# Patient Record
Sex: Female | Born: 1935 | Race: White | Hispanic: No
Health system: Southern US, Community
[De-identification: ages and names within clinical notes are randomized; demographics above are authoritative.]

---

## 2004-09-13 ENCOUNTER — Ambulatory Visit: Payer: Self-pay | Admitting: Family Medicine

## 2004-10-25 ENCOUNTER — Ambulatory Visit: Payer: Self-pay | Admitting: Family Medicine

## 2006-07-09 IMAGING — US US THYROID
1 series · 17 of 25 positions shown · non-contrast
Comparison: none

REASON FOR EXAM: elevated TSH
COMMENTS:

[Series 1: us thyroid · 17 of 33 slices shown]
[im 1/33]
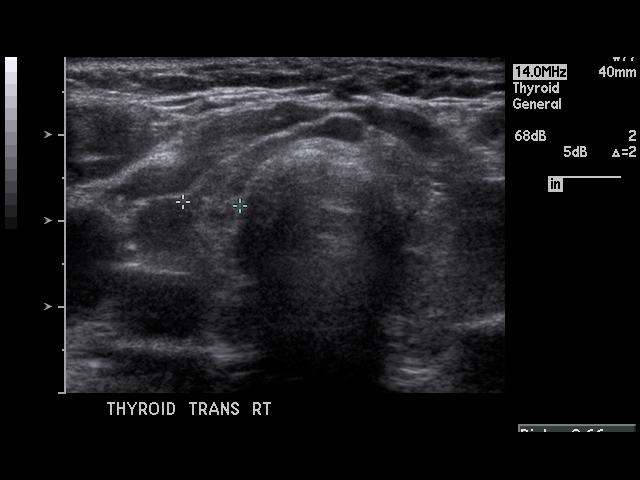
[im 3/33]
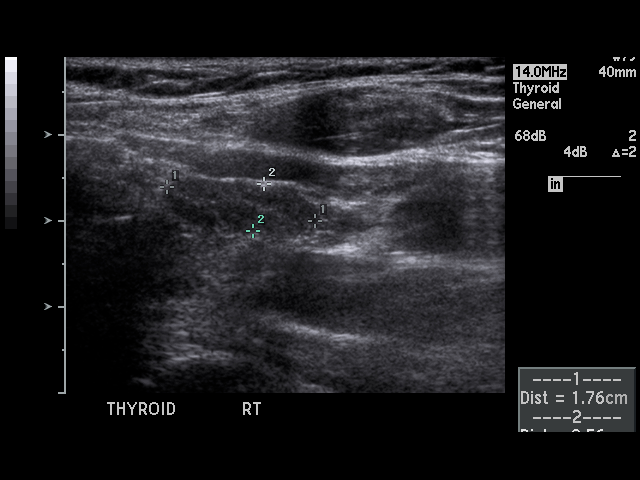
[im 5/33]
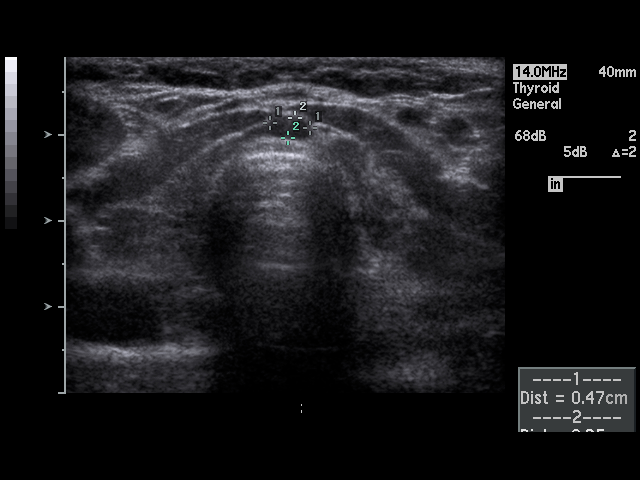
[im 7/33]
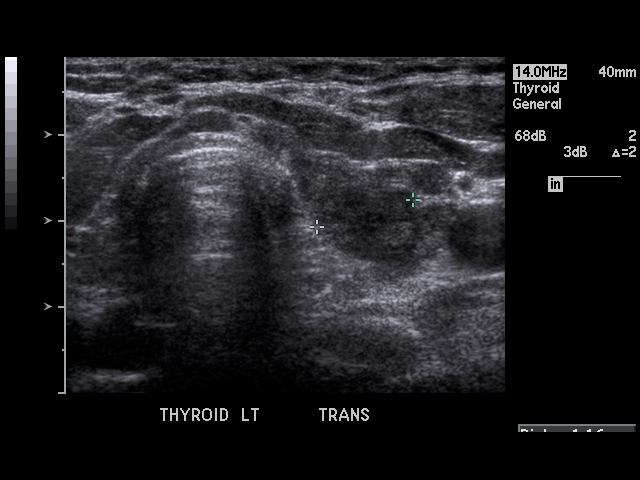
[im 9/33]
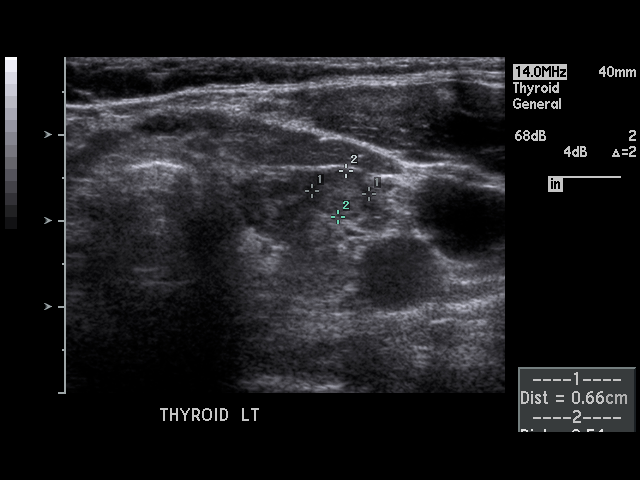
[im 11/33]
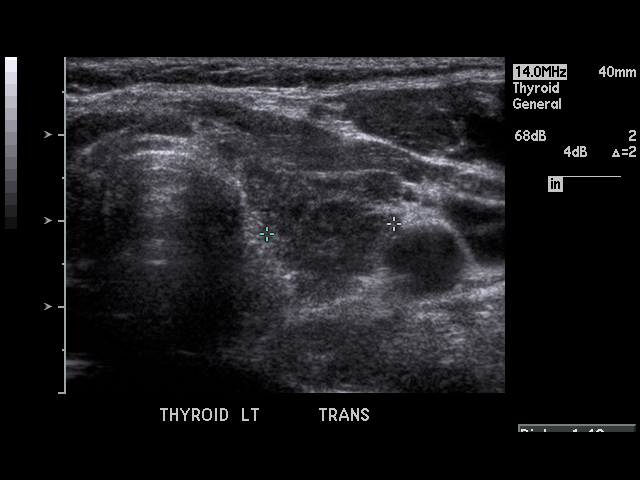
[im 13/33]
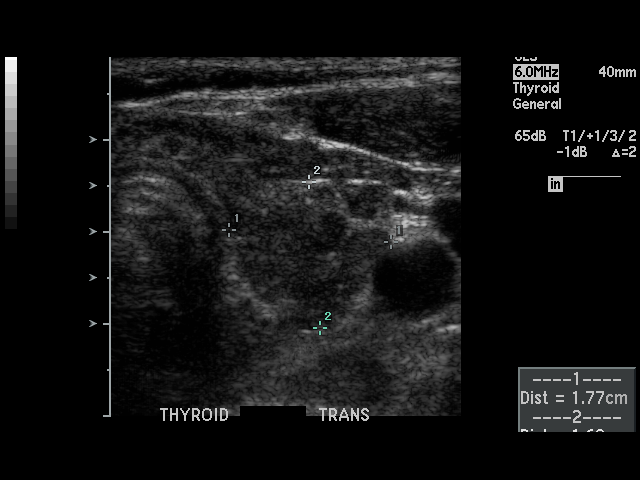
[im 15/33]
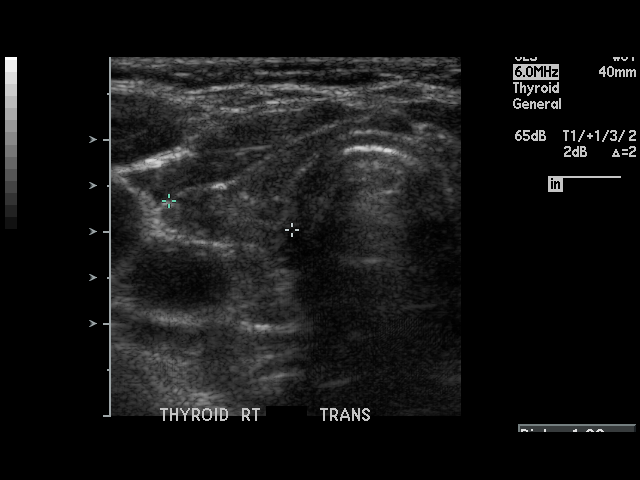
[im 17/33]
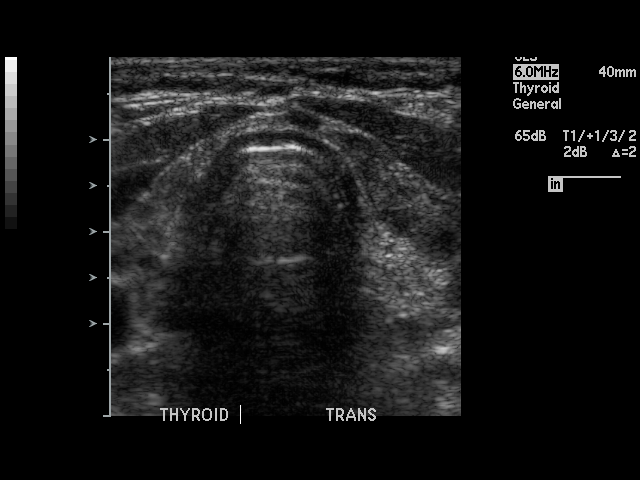
[im 18/33]
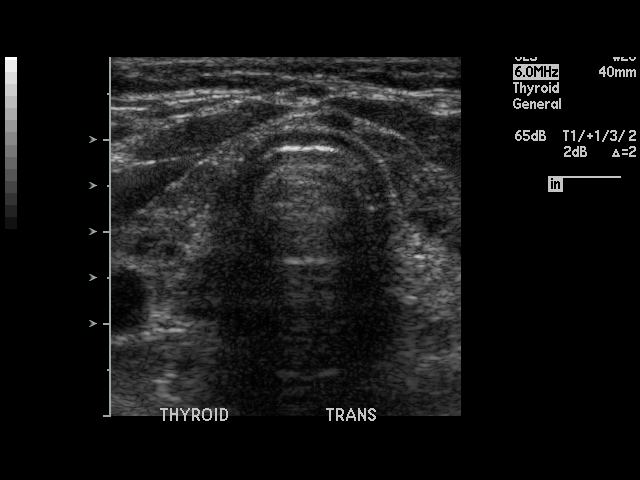
[im 21/33]
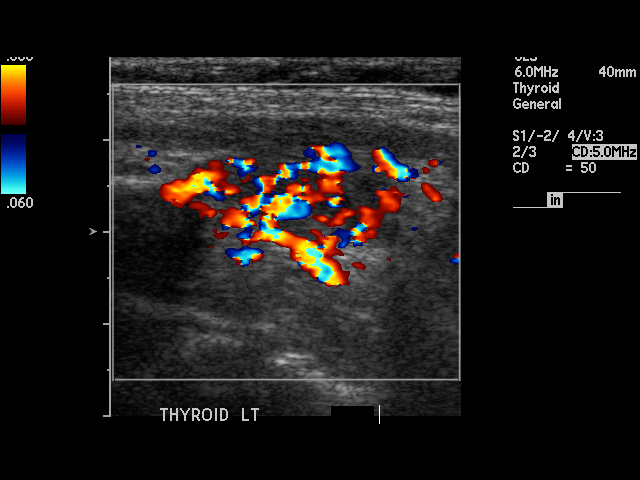
[im 22/33]
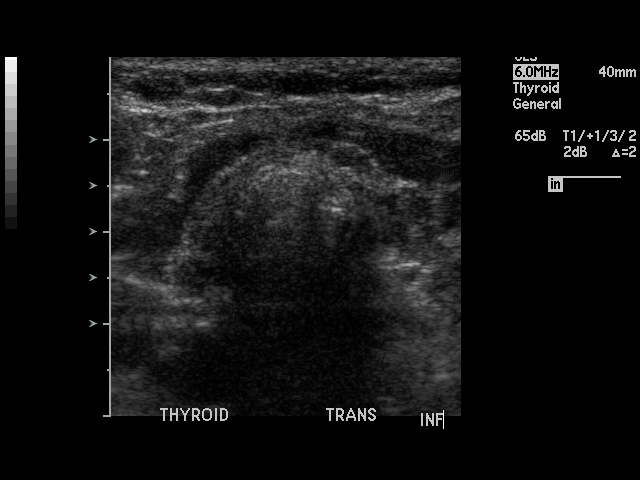
[im 25/33]
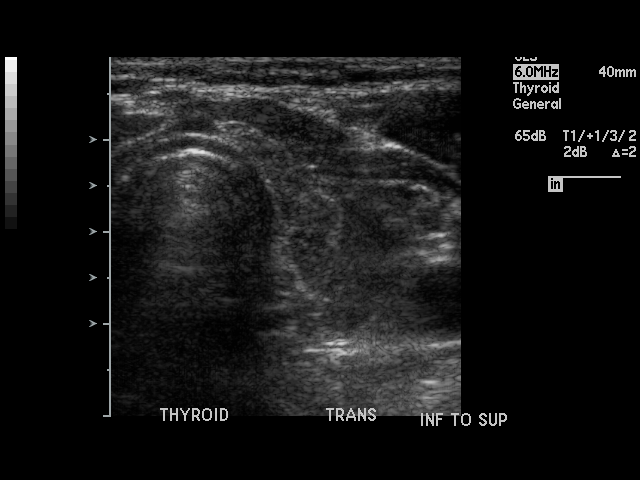
[im 26/33]
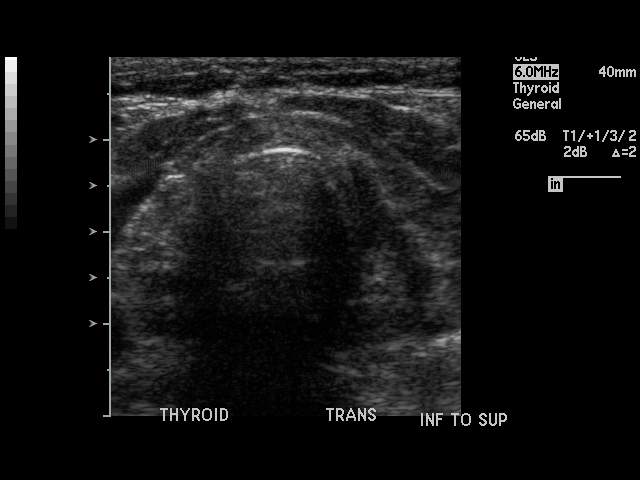
[im 29/33]
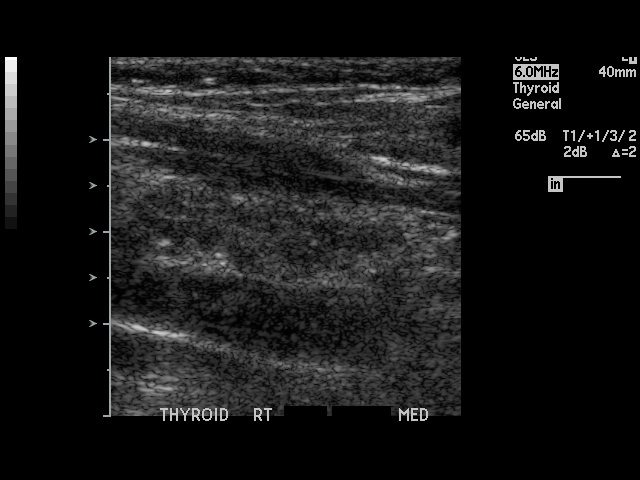
[im 30/33]
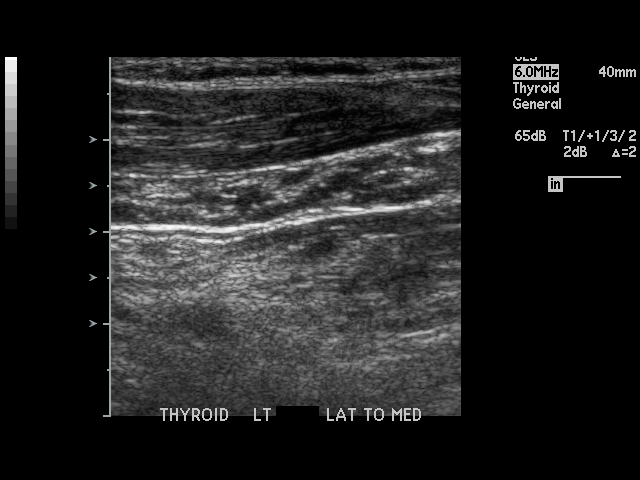
[im 33/33]
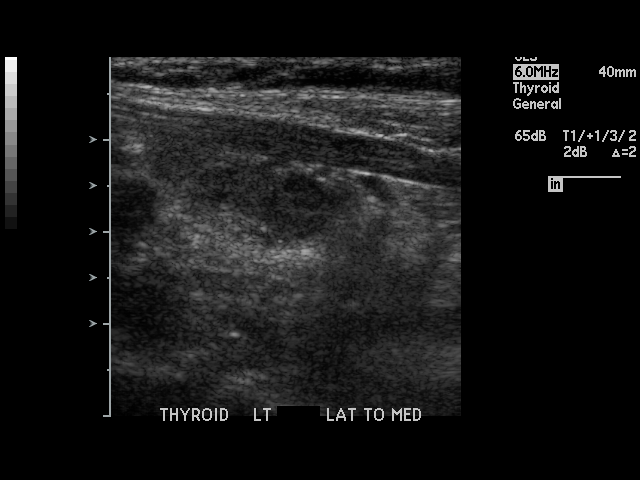

[17 of 25 positions shown; findings below may reference images not displayed]

PROCEDURE:     US  - US THYROID  - September 13, 2004  [DATE]

RESULT:        The RIGHT lobe measures 1.76 cm x .56 cm x .66 cm and the
LEFT lobe measures 2.92 cm x 1.6 cm x 1.77 cm.  In the LEFT lobe there are
two hypoechoic nodules with the larger measuring .75 cm in diameter and the
smaller measuring .52 cm in diameter.  There is also noted a .47 cm
hypoechoic nodule in the isthmus. No definite nodules are seen on the RIGHT.
IMPRESSION: 1.     The thyroid lobes are within the limits of normal for size.
2.     There are noted three small thyroid nodules in the LEFT lobe and
isthmus as noted above.

## 2009-12-07 ENCOUNTER — Ambulatory Visit: Payer: Self-pay | Admitting: Gastroenterology

## 2009-12-08 LAB — PATHOLOGY REPORT

## 2009-12-11 ENCOUNTER — Ambulatory Visit: Payer: Self-pay | Admitting: Gastroenterology

## 2009-12-21 ENCOUNTER — Ambulatory Visit: Payer: Self-pay | Admitting: Surgery

## 2009-12-22 LAB — CEA: CEA: 2.6 ng/mL (ref 0.0–4.7)

## 2009-12-29 ENCOUNTER — Inpatient Hospital Stay: Payer: Self-pay | Admitting: Surgery

## 2010-06-26 ENCOUNTER — Ambulatory Visit: Payer: Self-pay | Admitting: Family Medicine

## 2011-02-21 DEATH — deceased

## 2011-03-25 ENCOUNTER — Ambulatory Visit: Payer: Self-pay | Admitting: Gastroenterology

## 2011-03-26 LAB — PATHOLOGY REPORT

## 2011-10-06 IMAGING — CT CT ABD-PELV W/ CM
1 of 2 series · 14 of 32 positions shown, 18 images · non-contrast
Comparison: none

REASON FOR EXAM: SIGMOID COLON MASS
COMMENTS:

[Series 2: soft tissue · axial · 0.67mm/px · z∈[-900,-510]mm · 14 of 89 slices shown, 18 images]
[im 7/89  soft-tissue]
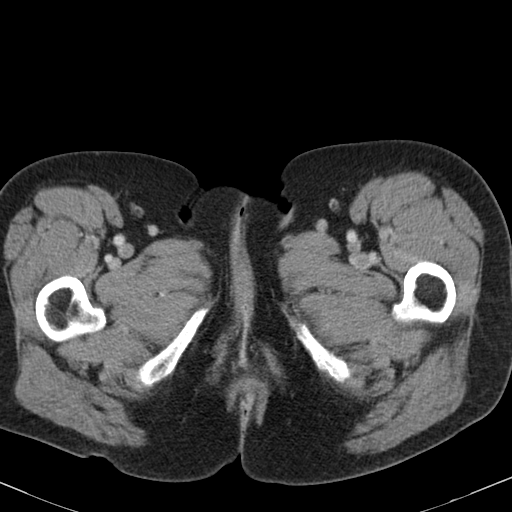
[im 7/89  bone]
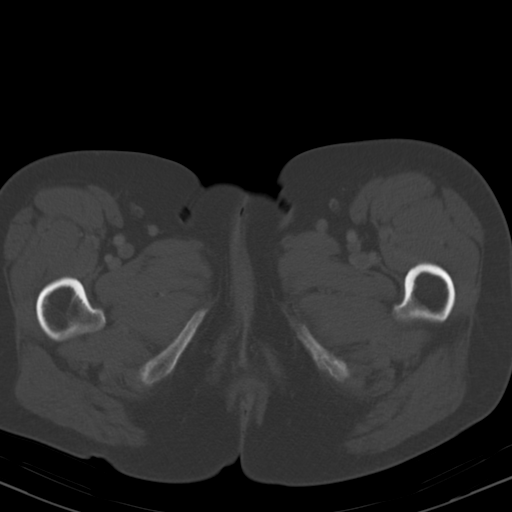
[im 14/89  soft-tissue]
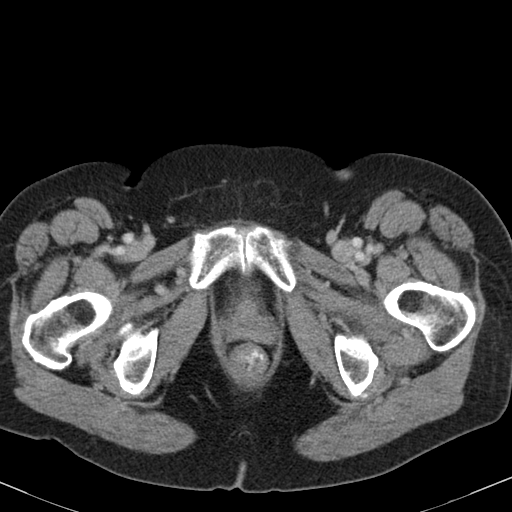
[im 21/89  soft-tissue]
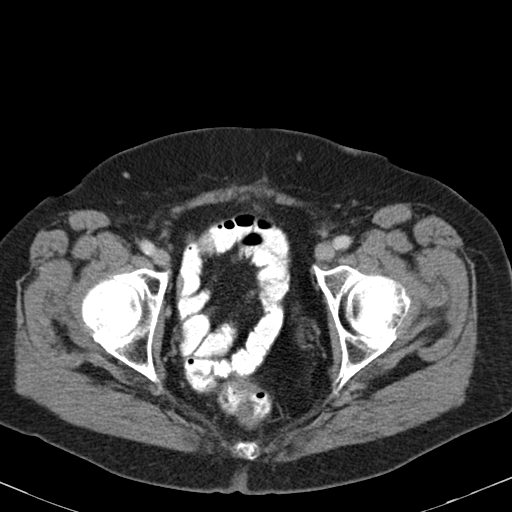
[im 28/89  soft-tissue]
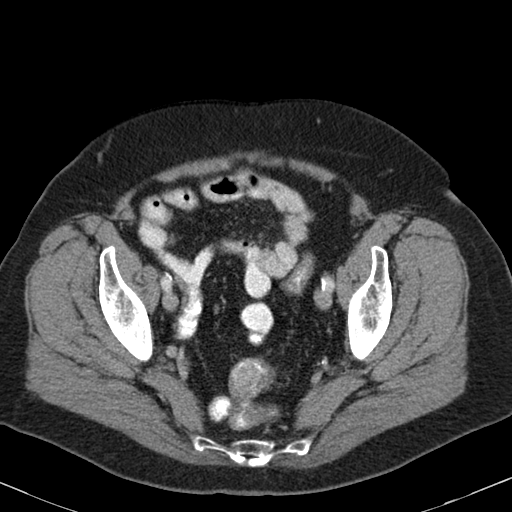
[im 34/89  soft-tissue]
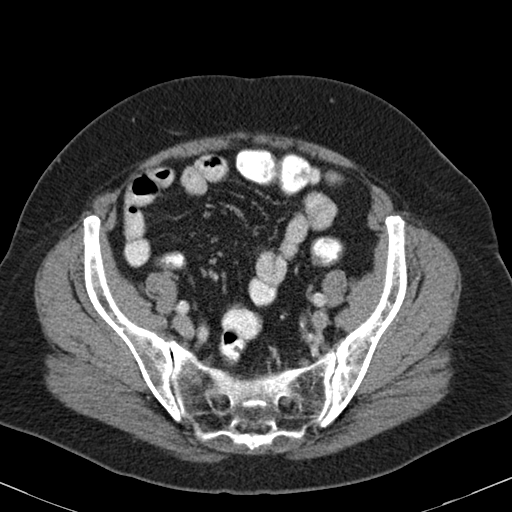
[im 41/89  soft-tissue]
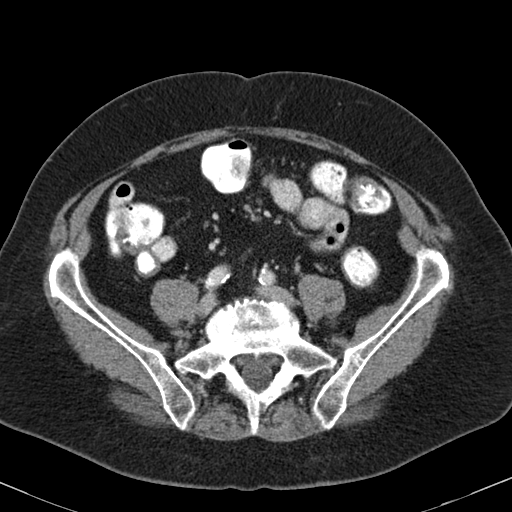
[im 48/89  soft-tissue]
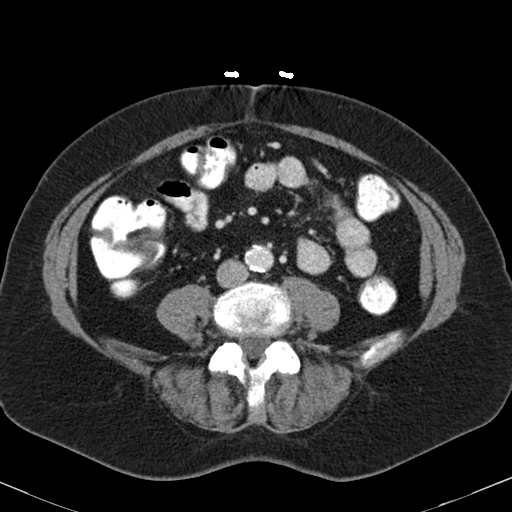
[im 55/89  soft-tissue]
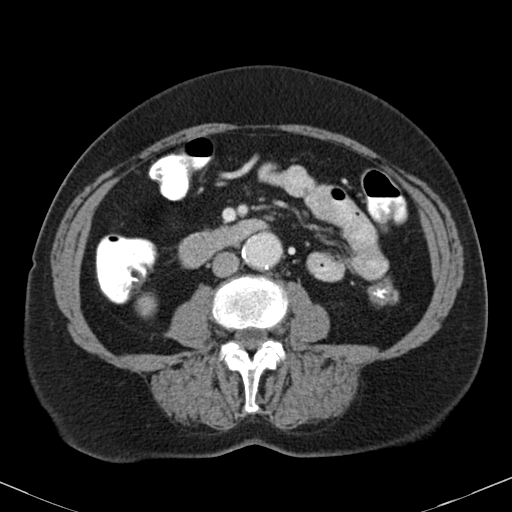
[im 61/89  soft-tissue]
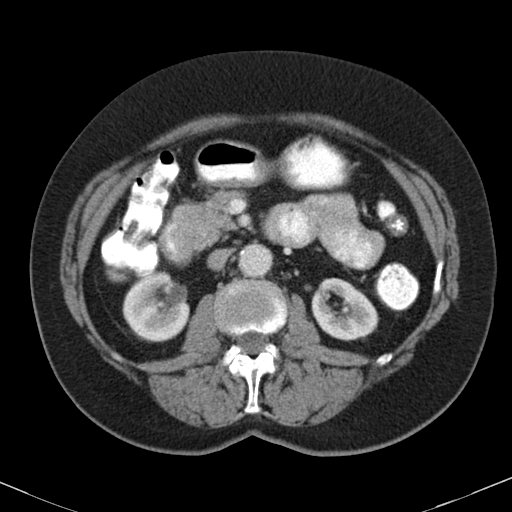
[im 61/89  bone]
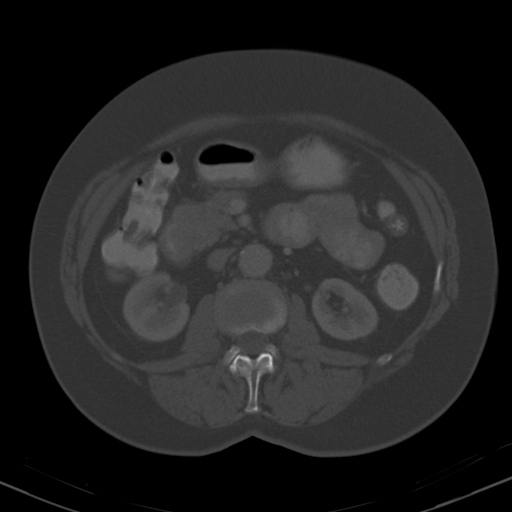
[im 68/89  soft-tissue]
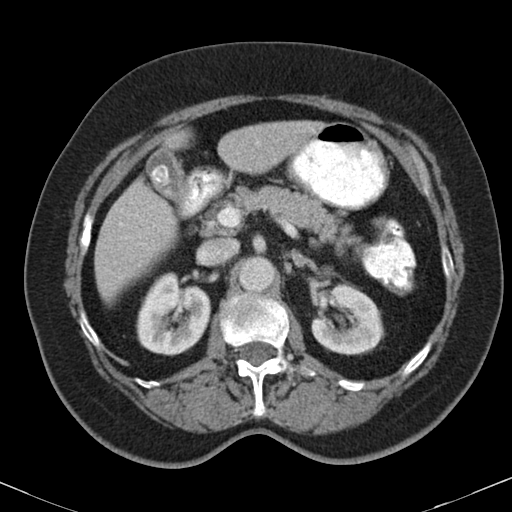
[im 75/89  soft-tissue]
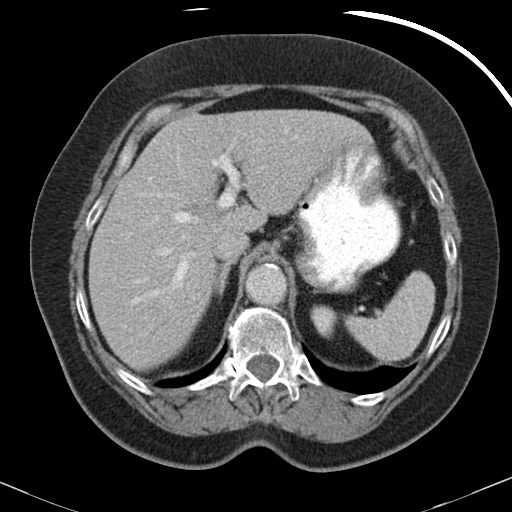
[im 75/89  lung]
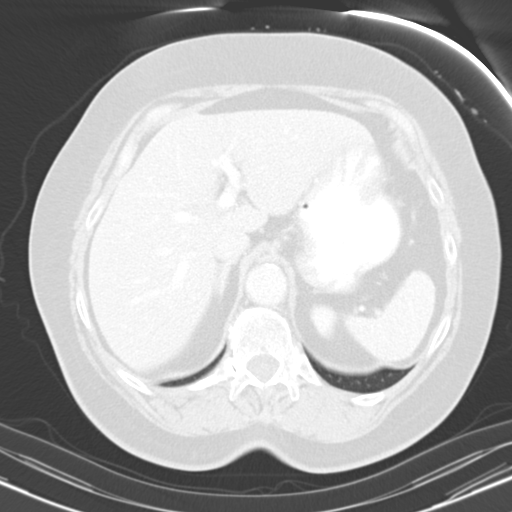
[im 78/89  lung]
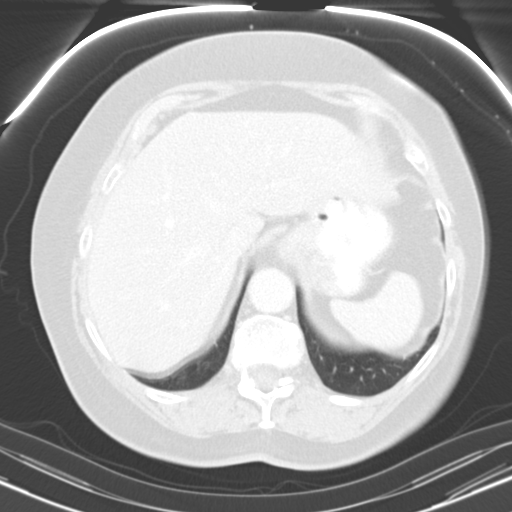
[im 82/89  soft-tissue]
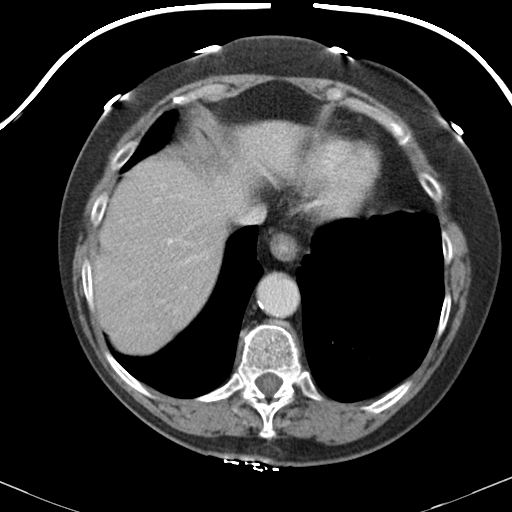
[im 82/89  lung]
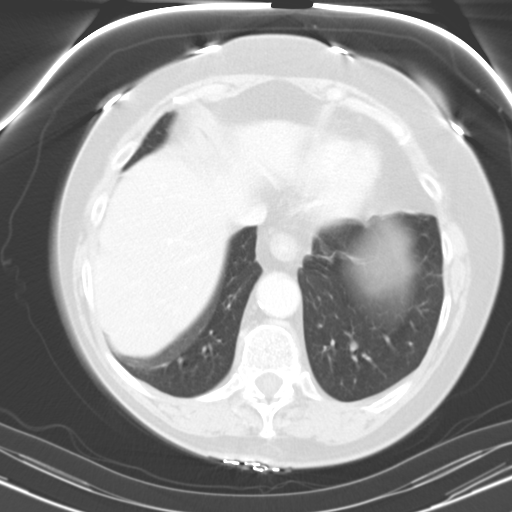
[im 85/89  lung]
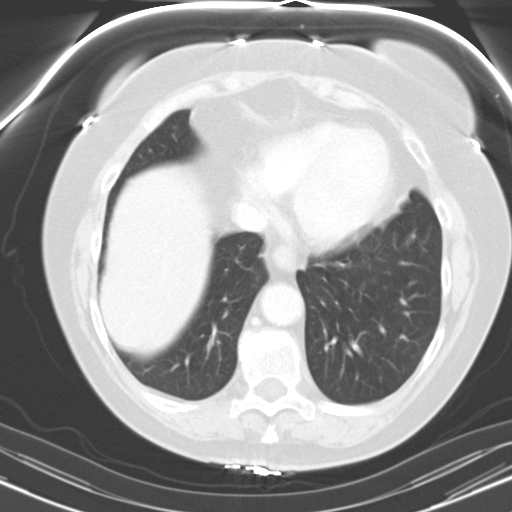

[14 of 32 positions shown; findings below may reference images not displayed]

PROCEDURE:     MOSZAT - MOSZAT ABDOMEN / PELVIS W  - December 11, 2009 [DATE]

RESULT:     Axial CT scanning was performed through the abdomen and pelvis
at 5 mm intervals and slice thicknesses following intravenous administration
of 100 cc of Gsovue-SWI as well as administration of oral contrast material.
Review of multiplanar reconstructed images was performed separately on the
VIA monitor. The patient reportedly has a sigmoid mass.

The orally administered contrast has traversed the small and large bowel and
reached the rectum. I do not see a discrete mass. The perisigmoid fat
exhibits normal density. I see no pelvic lymphadenopathy or free fluid.
There is no evidence of the small or large bowel obstruction. The terminal
ileum is normal in appearance. I do not see enlarged mesenteric lymph nodes.

The gallbladder contains multiple rim calcified stones. I see no gallbladder
wall thickening or pericholecystic fluid. The liver, spleen, stomach,
pancreas, adrenal glands, and kidneys exhibit no acute abnormality. There is
a cortically based subcentimeter hypodensity in the posterior midpole of the
left kidney that exhibits Hounsfield measurement of +7. This likely reflects
a cyst. Exophytic from the anterior aspect of the midpole of the left kidney
there is a 1.6 x 1.3 cm diameter hypodensity with Hounsfield measurement of
+12 most compatible with a cyst.

There is a short segment infrarenal abdominal aortic aneurysm. The maximal
transverse dimension is 2.7 cm. The maximal AP dimension is 2.6 cm. I see no
perianeurysmal leakage of blood. There is no periaortic or pericaval
lymphadenopathy. The lumbar vertebral bodies are preserved in height. The
lung bases are clear.
IMPRESSION: 1. I do not see CT evidence of a sigmoid mass. The sigmoid is not well
distended but contrast has reached the anus. I see no perisigmoid
inflammatory change or other abnormal soft tissue mass. There is no pelvic
lymphadenopathy.
2. The urinary bladder is normal in appearance. The uterus is surgically
absent. I see no adnexal masses.
3. There are hypodensities associated with the left kidney most compatible
with cysts.
4. There is a small infrarenal abdominal aortic aneurysm without evidence of
leakage.
5. There are multiple gallstones present. I see no evidence of abnormality
of the liver or pancreas or spleen.

## 2012-10-26 ENCOUNTER — Ambulatory Visit: Payer: Self-pay | Admitting: Anesthesiology

## 2013-12-11 ENCOUNTER — Ambulatory Visit: Payer: Self-pay

## 2021-11-15 ENCOUNTER — Emergency Department
Admission: EM | Admit: 2021-11-15 | Discharge: 2021-11-15 | Disposition: A | Discharge status: Skilled Nursing Facility | Payer: Medicare Other | Attending: Emergency Medicine | Admitting: Emergency Medicine

## 2021-11-15 ENCOUNTER — Other Ambulatory Visit: Payer: Self-pay

## 2021-11-15 ENCOUNTER — Emergency Department: Payer: Medicare Other

## 2021-11-15 DIAGNOSIS — S0181XA Laceration without foreign body of other part of head, initial encounter: Secondary | ICD-10-CM | POA: Diagnosis not present

## 2021-11-15 DIAGNOSIS — S0083XA Contusion of other part of head, initial encounter: Secondary | ICD-10-CM | POA: Insufficient documentation

## 2021-11-15 DIAGNOSIS — F0390 Unspecified dementia without behavioral disturbance: Secondary | ICD-10-CM | POA: Diagnosis not present

## 2021-11-15 DIAGNOSIS — W19XXXA Unspecified fall, initial encounter: Secondary | ICD-10-CM | POA: Diagnosis not present

## 2021-11-15 DIAGNOSIS — S0993XA Unspecified injury of face, initial encounter: Secondary | ICD-10-CM | POA: Diagnosis present

## 2021-11-15 NOTE — ED Triage Notes (Signed)
Per EMS pt had an observed fall in a community area at [employer]. Pt has a lac to the R forehead. Per EMS pt is not on blood thinners, DNR form with her papers.

## 2021-11-15 NOTE — ED Notes (Signed)
[facility]  CALLED  FOR  TRANSPORT   TO  [facility] 

## 2021-11-15 NOTE — Discharge Instructions (Signed)
We evaluated Leah Mejia and fortunately she has no evidence of severe injury as a result of her fall.  Please have her follow-up with her regular doctor.  Please continue giving her her regular medications.  Try to keep the wound clean and dry.  If she pulls off the tape or glue, the wound will heal on its own, but it would be better to leave the glue and tape in place.

## 2021-11-15 NOTE — ED Notes (Signed)
ED Provider at bedside. This RN and another RN held pt so MD could clean the wound and glue it and place a steri strip.
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
## 2022-02-20 DEATH — deceased
